# Patient Record
Sex: Male | Born: 1975 | Race: White | Hispanic: Yes | Marital: Single | Smoking: Current some day smoker
Health system: Southern US, Community
[De-identification: ages and names within clinical notes are randomized; demographics above are authoritative.]

## PROBLEM LIST (undated history)

## (undated) DIAGNOSIS — F329 Major depressive disorder, single episode, unspecified: Secondary | ICD-10-CM

## (undated) DIAGNOSIS — F32A Depression, unspecified: Secondary | ICD-10-CM

## (undated) DIAGNOSIS — S82899A Other fracture of unspecified lower leg, initial encounter for closed fracture: Secondary | ICD-10-CM

## (undated) DIAGNOSIS — M1990 Unspecified osteoarthritis, unspecified site: Secondary | ICD-10-CM

## (undated) DIAGNOSIS — K219 Gastro-esophageal reflux disease without esophagitis: Secondary | ICD-10-CM

## (undated) DIAGNOSIS — G709 Myoneural disorder, unspecified: Secondary | ICD-10-CM

## (undated) HISTORY — DX: Depression, unspecified: F32.A

## (undated) HISTORY — DX: Myoneural disorder, unspecified: G70.9

## (undated) HISTORY — DX: Major depressive disorder, single episode, unspecified: F32.9

## (undated) HISTORY — DX: Gastro-esophageal reflux disease without esophagitis: K21.9

---

## 2012-03-09 ENCOUNTER — Emergency Department (HOSPITAL_COMMUNITY)
Admission: EM | Admit: 2012-03-09 | Discharge: 2012-03-10 | Disposition: A | Discharge status: Home / Self Care | Payer: Self-pay | Attending: Emergency Medicine | Admitting: Emergency Medicine

## 2012-03-09 ENCOUNTER — Encounter (HOSPITAL_COMMUNITY): Payer: Self-pay | Admitting: *Deleted

## 2012-03-09 DIAGNOSIS — N451 Epididymitis: Secondary | ICD-10-CM

## 2012-03-09 DIAGNOSIS — N453 Epididymo-orchitis: Secondary | ICD-10-CM | POA: Insufficient documentation

## 2012-03-09 DIAGNOSIS — Z8739 Personal history of other diseases of the musculoskeletal system and connective tissue: Secondary | ICD-10-CM | POA: Insufficient documentation

## 2012-03-09 HISTORY — DX: Other fracture of unspecified lower leg, initial encounter for closed fracture: S82.899A

## 2012-03-09 HISTORY — DX: Unspecified osteoarthritis, unspecified site: M19.90

## 2012-03-09 LAB — URINALYSIS, ROUTINE W REFLEX MICROSCOPIC
Bilirubin Urine: NEGATIVE
Glucose, UA: NEGATIVE mg/dL
Ketones, ur: NEGATIVE mg/dL
pH: 5.5 (ref 5.0–8.0)

## 2012-03-09 LAB — URINE MICROSCOPIC-ADD ON

## 2012-03-09 MED ORDER — DOXYCYCLINE HYCLATE 100 MG PO CAPS: 100.0000 mg | ORAL_CAPSULE | Freq: Two times a day (BID) | ORAL | Status: DC

## 2012-03-09 NOTE — ED Notes (Signed)
Pt began exp L testicular pain yesterday.  Today the pain increased and he began feeling light-headed and nauseated.  Pt states minimal swelling, but no redness.

## 2012-03-09 NOTE — ED Provider Notes (Signed)
History     CSN: [id number]  Arrival date & time 03/09/12  2155   First MD Initiated Contact with Patient 03/09/12 2336      Chief Complaint  Patient presents with  . Testicle Pain    (Consider location/radiation/quality/duration/timing/severity/associated sxs/prior treatment) HPI Comments: 36 year old male with a history of left testicular pain which started yesterday, gradual in onset, persistent and associated with mild swelling. He denies any urethral discharge, dysuria, abdominal pain, fevers chills nausea or vomiting.  He admits that he may have a sexual transmitted disease based on risk factors but has no signs or symptoms of this. He denies any diarrhea, rectal bleeding, constipation. Symptoms are persistent  Patient is a 36 y.o. male presenting with testicular pain. The history is provided by the patient.  Testicle Pain Pertinent negatives include no abdominal pain.    Past Medical History  Diagnosis Date  . Arthritis   . Ankle fracture     History reviewed. No pertinent past surgical history.  No family history on file.  History  Substance Use Topics  . Smoking status: Not on file  . Smokeless tobacco: Not on file  . Alcohol Use:       Review of Systems  Constitutional: Negative for fever.  Gastrointestinal: Negative for abdominal pain.  Genitourinary: Positive for testicular pain. Negative for dysuria.    Allergies  Review of patient's allergies indicates no known allergies.  Home Medications   Current Outpatient Rx  Name Route Sig Dispense Refill  . DOXYCYCLINE HYCLATE 100 MG PO CAPS Oral Take 1 capsule (100 mg total) by mouth 2 (two) times daily. 20 capsule 0    BP 130/91  Pulse 90  Temp 99.1 F (37.3 C) (Oral)  Resp 18  SpO2 95%  Physical Exam  Nursing note and vitals reviewed. Constitutional: He appears well-developed and well-nourished. No distress.  HENT:  Head: Normocephalic and atraumatic.  Eyes: Conjunctivae normal are normal.  No scleral icterus.  Abdominal: Soft. He exhibits no distension. There is no tenderness.  Genitourinary:       Normal appearing scrotum and testicles, normal cremasteric reflexes bilaterally, no erythema induration or tenderness to the scrotum,no masses to the bilateral testicles, left testicle with minimal tenderness over the epididymis, normal position in the scrotum, no urethral dc    ED Course  Procedures (including critical care time)  Labs Reviewed  URINALYSIS, ROUTINE W REFLEX MICROSCOPIC - Abnormal; Notable for the following:    APPearance CLOUDY (*)     Specific Gravity, Urine 1.037 (*)     Protein, ur 30 (*)     Leukocytes, UA SMALL (*)     All other components within normal limits  URINE MICROSCOPIC-ADD ON  GC/CHLAMYDIA PROBE AMP, GENITAL   No results found.   1. Epididymitis       MDM  Focal left testicular pain over the epididymis, normal cremasteric reflex, doubt testicular torsion, GC Chlamydia obtained, antibiotics for home. Urology followup recommended, contact information given        Vida Roller, MD 03/09/12 2349

## 2012-03-11 LAB — GC/CHLAMYDIA PROBE AMP, GENITAL: Chlamydia, DNA Probe: NEGATIVE

## 2013-04-21 ENCOUNTER — Ambulatory Visit: Payer: Self-pay

## 2013-04-21 ENCOUNTER — Ambulatory Visit: Payer: Self-pay | Admitting: Family Medicine

## 2013-04-21 DIAGNOSIS — M25569 Pain in unspecified knee: Secondary | ICD-10-CM

## 2013-04-21 DIAGNOSIS — S8001XA Contusion of right knee, initial encounter: Secondary | ICD-10-CM

## 2013-04-21 DIAGNOSIS — R071 Chest pain on breathing: Secondary | ICD-10-CM

## 2013-04-21 DIAGNOSIS — M542 Cervicalgia: Secondary | ICD-10-CM

## 2013-04-21 DIAGNOSIS — Z043 Encounter for examination and observation following other accident: Secondary | ICD-10-CM

## 2013-04-21 DIAGNOSIS — R0789 Other chest pain: Secondary | ICD-10-CM

## 2013-04-21 DIAGNOSIS — M79671 Pain in right foot: Secondary | ICD-10-CM

## 2013-04-21 DIAGNOSIS — M25561 Pain in right knee: Secondary | ICD-10-CM

## 2013-04-21 DIAGNOSIS — M545 Low back pain, unspecified: Secondary | ICD-10-CM

## 2013-04-21 DIAGNOSIS — J45909 Unspecified asthma, uncomplicated: Secondary | ICD-10-CM

## 2013-04-21 DIAGNOSIS — M79609 Pain in unspecified limb: Secondary | ICD-10-CM

## 2013-04-21 DIAGNOSIS — S9031XA Contusion of right foot, initial encounter: Secondary | ICD-10-CM

## 2013-04-21 DIAGNOSIS — M25519 Pain in unspecified shoulder: Secondary | ICD-10-CM

## 2013-04-21 DIAGNOSIS — S8000XA Contusion of unspecified knee, initial encounter: Secondary | ICD-10-CM

## 2013-04-21 DIAGNOSIS — S9030XA Contusion of unspecified foot, initial encounter: Secondary | ICD-10-CM

## 2013-04-21 MED ORDER — MOMETASONE FURO-FORMOTEROL FUM 100-5 MCG/ACT IN AERO: 2.0000 | INHALATION_SPRAY | Freq: Two times a day (BID) | RESPIRATORY_TRACT | Status: DC

## 2013-04-21 MED ORDER — METHOCARBAMOL 750 MG PO TABS: ORAL_TABLET | ORAL | Status: DC

## 2013-04-21 MED ORDER — DICLOFENAC SODIUM 75 MG PO TBEC: 75.0000 mg | DELAYED_RELEASE_TABLET | Freq: Two times a day (BID) | ORAL | Status: DC

## 2013-04-21 MED ORDER — TRAMADOL HCL 50 MG PO TABS: 50.0000 mg | ORAL_TABLET | Freq: Three times a day (TID) | ORAL | Status: DC | PRN
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
## 2013-04-21 NOTE — Patient Instructions (Signed)
Take the muscle relaxant, methocarbamol, one in the morning, one in the afternoon, and 2 at bedtime  Take the diclofenac one twice daily. Do not take Aleve or ibuprofen while taking this.  Take the tramadol one every 6 hours as needed for more severe pain  Continue working with your chiropractor to get too loose and backup there

## 2013-04-23 ENCOUNTER — Telehealth: Payer: Self-pay

## 2013-04-23 NOTE — Telephone Encounter (Signed)
Pt calling back wanting to know update on medication. I told him we would need to await response from Dr Alwyn Ren. He understood.

## 2013-04-23 NOTE — Telephone Encounter (Signed)
Patient wants to be called at [phone].

## 2013-04-23 NOTE — Telephone Encounter (Signed)
Call patient: 1:  I prescribed robaxin, the muscle relaxant.  That is what I am sure I told him I was giving him.  2:  I do not think he needs a lot of strong pain meds.  I am not there tomorrow to prescribe for him, but if a PA will give him Norco 5/325 #20 One q6 prn severe pain  I would appreciate it, or I will write it on Friday.

## 2013-04-23 NOTE — Telephone Encounter (Signed)
PT STATES THE TRAMADOL HE WAS GIVEN ISN'T HELPING AT ALL, WOULD LIKE TO HAVE SOME HYDROCODONE INSTEAD AND ALSO HE WAS SUPPOSE TO GET FLEXERIL BUT DIDN'T PLEASE CALL PT AT [phone]   [facility] ON [address]

## 2013-04-24 MED ORDER — HYDROCODONE-ACETAMINOPHEN 5-325 MG PO TABS: 1.0000 | ORAL_TABLET | Freq: Four times a day (QID) | ORAL | Status: DC | PRN

## 2013-04-24 NOTE — Telephone Encounter (Signed)
Patient advised ready for pick up

## 2013-04-24 NOTE — Telephone Encounter (Signed)
Will call when Rx signed. He speaks [place]

## 2013-04-24 NOTE — Telephone Encounter (Signed)
Rx printed

## 2013-04-24 NOTE — Telephone Encounter (Signed)
LMOM to CB, he may need a spanish speaker to help him.

## 2013-04-24 NOTE — Telephone Encounter (Signed)
Pended will you print and sign for patient, per Dr Alwyn Ren?

## 2013-05-02 ENCOUNTER — Ambulatory Visit: Payer: Self-pay | Admitting: Family Medicine

## 2013-05-02 ENCOUNTER — Telehealth: Payer: Self-pay | Admitting: Radiology

## 2013-05-02 ENCOUNTER — Telehealth: Payer: Self-pay

## 2013-05-02 DIAGNOSIS — M25561 Pain in right knee: Secondary | ICD-10-CM

## 2013-05-02 DIAGNOSIS — R0789 Other chest pain: Secondary | ICD-10-CM

## 2013-05-02 DIAGNOSIS — M542 Cervicalgia: Secondary | ICD-10-CM

## 2013-05-02 DIAGNOSIS — R071 Chest pain on breathing: Secondary | ICD-10-CM

## 2013-05-02 DIAGNOSIS — M25569 Pain in unspecified knee: Secondary | ICD-10-CM

## 2013-05-02 DIAGNOSIS — M79671 Pain in right foot: Secondary | ICD-10-CM

## 2013-05-02 DIAGNOSIS — M79609 Pain in unspecified limb: Secondary | ICD-10-CM

## 2013-05-02 MED ORDER — DICLOFENAC SODIUM 75 MG PO TBEC: 75.0000 mg | DELAYED_RELEASE_TABLET | Freq: Two times a day (BID) | ORAL | Status: DC

## 2013-05-02 MED ORDER — METHOCARBAMOL 750 MG PO TABS: ORAL_TABLET | ORAL | Status: DC

## 2013-05-02 MED ORDER — HYDROCODONE-ACETAMINOPHEN 5-325 MG PO TABS: 1.0000 | ORAL_TABLET | Freq: Four times a day (QID) | ORAL | Status: DC | PRN

## 2013-05-02 NOTE — Telephone Encounter (Signed)
Error

## 2013-05-02 NOTE — Telephone Encounter (Signed)
Pt states that his back is still in pain and he would like a refill on hydcrodone. Best# [phone]
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
## 2013-05-02 NOTE — Patient Instructions (Signed)
Use the pain medication as needed.  Be careful of sedation.  You can use the voltaren for inflammation, and the robaxin as needed for muscle spasm.  Let us know if you are not better in the next week or so- Sooner if worse.

## 2013-05-02 NOTE — Telephone Encounter (Signed)
Called patient. He should return to clinic for renewal.

## 2013-05-19 ENCOUNTER — Telehealth: Payer: Self-pay

## 2013-05-19 DIAGNOSIS — M25561 Pain in right knee: Secondary | ICD-10-CM

## 2013-05-19 DIAGNOSIS — M542 Cervicalgia: Secondary | ICD-10-CM

## 2013-05-19 DIAGNOSIS — M79671 Pain in right foot: Secondary | ICD-10-CM

## 2013-05-19 DIAGNOSIS — R0789 Other chest pain: Secondary | ICD-10-CM

## 2013-05-19 MED ORDER — DICLOFENAC SODIUM 75 MG PO TBEC: 75.0000 mg | DELAYED_RELEASE_TABLET | Freq: Two times a day (BID) | ORAL | Status: DC

## 2013-05-19 MED ORDER — METHOCARBAMOL 750 MG PO TABS: ORAL_TABLET | ORAL | Status: DC
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
## 2013-05-19 NOTE — Telephone Encounter (Signed)
Pended please advise.  

## 2013-05-19 NOTE — Telephone Encounter (Signed)
Pt requesting refill for pain meds/muscle relaxers   Best phone for pt is [phone]  Please call pt when ready to be picked up.

## 2014-11-22 ENCOUNTER — Ambulatory Visit (HOSPITAL_COMMUNITY)
Admission: RE | Admit: 2014-11-22 | Discharge: 2014-11-22 | Disposition: A | Discharge status: Home / Self Care | Payer: Self-pay | Source: Ambulatory Visit | Attending: Emergency Medicine | Admitting: Emergency Medicine

## 2014-11-22 ENCOUNTER — Ambulatory Visit (INDEPENDENT_AMBULATORY_CARE_PROVIDER_SITE_OTHER): Payer: Self-pay

## 2014-11-22 ENCOUNTER — Encounter (HOSPITAL_COMMUNITY): Payer: Self-pay

## 2014-11-22 ENCOUNTER — Ambulatory Visit (INDEPENDENT_AMBULATORY_CARE_PROVIDER_SITE_OTHER): Payer: Self-pay | Admitting: Emergency Medicine

## 2014-11-22 VITALS — BP 140/80 | HR 107 | Temp 98.2°F | Resp 18 | Ht 67.5 in | Wt 201.2 lb

## 2014-11-22 DIAGNOSIS — K625 Hemorrhage of anus and rectum: Secondary | ICD-10-CM | POA: Insufficient documentation

## 2014-11-22 DIAGNOSIS — R1032 Left lower quadrant pain: Secondary | ICD-10-CM | POA: Insufficient documentation

## 2014-11-22 LAB — POCT CBC
Granulocyte percent: 69.8 %G (ref 37–80)
HCT, POC: 46.3 % (ref 43.5–53.7)
Hemoglobin: 14.8 g/dL (ref 14.1–18.1)
LYMPH, POC: 2.2 (ref 0.6–3.4)
MCH: 30 pg (ref 27–31.2)
MCHC: 32.1 g/dL (ref 31.8–35.4)
MCV: 93.6 fL (ref 80–97)
MID (CBC): 0.6 (ref 0–0.9)
MPV: 9.1 fL (ref 0–99.8)
POC Granulocyte: 6.5 (ref 2–6.9)
POC LYMPH %: 23.4 % (ref 10–50)
POC MID %: 6.8 %M (ref 0–12)
Platelet Count, POC: 226 10*3/uL (ref 142–424)
RBC: 4.94 M/uL (ref 4.69–6.13)
RDW, POC: 13.3 %
WBC: 9.3 10*3/uL (ref 4.6–10.2)

## 2014-11-22 LAB — HEMOCCULT GUIAC POC 1CARD (OFFICE): FECAL OCCULT BLD: NEGATIVE

## 2014-11-22 LAB — COMPLETE METABOLIC PANEL WITH GFR
ALBUMIN: 4.6 g/dL (ref 3.5–5.2)
ALT: 28 U/L (ref 0–53)
AST: 15 U/L (ref 0–37)
Alkaline Phosphatase: 68 U/L (ref 39–117)
BUN: 14 mg/dL (ref 6–23)
CHLORIDE: 103 meq/L (ref 96–112)
CO2: 30 meq/L (ref 19–32)
CREATININE: 0.88 mg/dL (ref 0.50–1.35)
Calcium: 9.7 mg/dL (ref 8.4–10.5)
GFR, Est Non African American: >89 mL/min
GLUCOSE: 99 mg/dL (ref 70–99)
Potassium: 4 mEq/L (ref 3.5–5.3)
SODIUM: 141 meq/L (ref 135–145)
TOTAL PROTEIN: 7.4 g/dL (ref 6.0–8.3)
Total Bilirubin: 0.6 mg/dL (ref 0.2–1.2)

## 2014-11-22 LAB — POCT SEDIMENTATION RATE: POCT SED RATE: 4 mm/h (ref 0–22)

## 2014-11-22 MED ORDER — DICYCLOMINE HCL 10 MG PO CAPS: ORAL_CAPSULE | ORAL | Status: AC

## 2014-11-22 MED ORDER — IOHEXOL 300 MG/ML  SOLN
100.0000 mL | Freq: Once | INTRAMUSCULAR | Status: AC | PRN
  Administered 2014-11-22: 100 mL via INTRAVENOUS

## 2014-11-22 NOTE — Addendum Note (Signed)
Addended by: Damita LackLORING, Miosha Behe S on: 11/22/2014 02:17 PM   Modules accepted: Orders

## 2014-11-22 NOTE — Progress Notes (Addendum)
Subjective:  This chart was scribed for Lesle ChrisSteven Indria Bishara, MD by Andrew Auaven Small, ED Scribe. This patient was seen in room 13 and the patient's care was started at 12:43 PM.   Patient ID: Thomas Moyer, male    DOB: 03/03/1976, 39 y.o.   MRN: [id number]  HPI  Chief Complaint  Patient presents with  . Diarrhea    x 5-7 days  . Blood In Stools    noticed after wiping. Has occasional abdominal pain also   HPI Comments: Thomas MeekLuis Manuel Moyer is a 39 y.o. male who presents to the [facility] complaining of blood in stool. Pt has noticed bright red blood with wiping and mixed in with his stool. He has associated occasional abdominal pain, nausea and watery diarrhea which he's had 4-5 episodes of today. Pt states he has been seen for similar symptoms in the past. Pt denies decreased appetite, weight loss and recent travels. Pt lives alone and denies sick contacts.   Past Medical History  Diagnosis Date  . Arthritis   . Ankle fracture   . Neuromuscular disorder   . Depression    No past surgical history on file. Prior to Admission medications   Medication Sig Start Date End Date Taking? Authorizing Provider  ranitidine (ZANTAC) 150 MG tablet Take 150 mg by mouth daily.   Yes Historical Provider, MD   Theodosia Paling\Review of Systems  Constitutional: Negative for appetite change and unexpected weight change.  Gastrointestinal: Positive for nausea, abdominal pain, diarrhea and blood in stool.   Objective:   Physical Exam CONSTITUTIONAL: Well developed/well nourished HEAD: Normocephalic/atraumatic EYES: EOMI/PERRL ENMT: Mucous membranes moist NECK: supple no meningeal signs SPINE/BACK:entire spine nontender CV: S1/S2 noted, no murmurs/rubs/gallops noted LUNGS: Lungs are clear to auscultation bilaterally, no apparent distress ABDOMEN: soft, no rebound or guarding, bowel sounds noted throughout abdomen. Distended abdomen. Tender deep left lower quadrant. GU:no cva tenderness. Rectal exam-  no mass NEURO: Pt is awake/alert/appropriate, moves all extremitiesx4.  No facial droop.   EXTREMITIES: pulses normal/equal, full ROM SKIN: warm, color normal PSYCH: no abnormalities of mood noted, alert and oriented to situation  Filed Vitals:   11/22/14 1158  BP: 140/80  Pulse: 107  Temp: 98.2 F (36.8 C)  TempSrc: Oral  Resp: 18  Height: 5' 7.5" (1.715 m)  Weight: 201 lb 4 oz (91.286 kg)  SpO2: 97%   Results for orders placed or performed in visit on 11/22/14  POCT CBC  Result Value Ref Range   WBC 9.3 4.6 - 10.2 K/uL   Lymph, poc 2.2 0.6 - 3.4   POC LYMPH PERCENT 23.4 10 - 50 %L   MID (cbc) 0.6 0 - 0.9   POC MID % 6.8 0 - 12 %M   POC Granulocyte 6.5 2 - 6.9   Granulocyte percent 69.8 37 - 80 %G   RBC 4.94 4.69 - 6.13 M/uL   Hemoglobin 14.8 14.1 - 18.1 g/dL   HCT, POC 40.946.3 81.143.5 - 53.7 %   MCV 93.6 80 - 97 fL   MCH, POC 30.0 27 - 31.2 pg   MCHC 32.1 31.8 - 35.4 g/dL   RDW, POC 91.413.3 %   Platelet Count, POC 226 142 - 424 K/uL   MPV 9.1 0 - 99.8 fL  [facility] reading (PRIMARY) by  Dr.Ladajah Soltys there is no free air. No evidence of obstruction. No calculi are seen.    Assessment & Plan:  Patient does have left lower quadrant pain. This certainly could  be irritable bowel syndrome. We'll see if we go ahead and CT is abdomen pelvis for left lower quadrant abdominal pain. I pReferral also made to GI further rectal bleedingersonally performed the services described in this documentation, which was scribed in my presence. The recorded information has been reviewed and is accurate. Call report CT abdomen pelvis is normal. Will treat with Bentyl Earl Lites, MD

## 2014-11-22 NOTE — Patient Instructions (Signed)
Register in the Emergency Room for your CT scan / tell them you are there for outpatient CT scan / go now  [facility] / [facility] department  Driving directions to [facility] [address]  [phone]  - more info    [address]  [place], [place] [address]     1. Head south on [address] toward [address]      0.5 mi    2. Sharp left onto [address]      0.6 mi    3. Turn left onto the [address]      0.2 mi    4. Merge onto [address]      3.0 mi    5. Continue straight to stay on [address]      0.4 mi    6. Slight left to stay on [address]      1.2 mi    7. Turn right onto [address]      0.1 mi    8. Turn left onto [address]      361 ft    9. Take the 1st left onto [address]  Destination will be on the right

## 2014-11-22 NOTE — Addendum Note (Signed)
Addended by: Lesle ChrisAUB, Vale Mousseau A on: 11/22/2014 04:41 PM   Modules accepted: Orders

## 2014-11-22 NOTE — Addendum Note (Signed)
Addended by: Damita LackLORING, DONNA S on: 11/22/2014 02:29 PM   Modules accepted: Orders

## 2014-11-22 NOTE — Progress Notes (Deleted)
   Subjective:    Patient ID: Thomas Moyer, male    DOB: Nov 15, 1975, 39 y.o.   MRN: [id number]  HPI    Review of Systems     Objective:   Physical Exam        Assessment & Plan:

## 2014-11-25 ENCOUNTER — Encounter: Payer: Self-pay | Admitting: Gastroenterology

## 2014-12-02 ENCOUNTER — Ambulatory Visit: Payer: Self-pay | Admitting: Gastroenterology

## 2015-02-02 ENCOUNTER — Ambulatory Visit: Payer: Self-pay | Admitting: Gastroenterology

## 2015-12-28 IMAGING — CT CT ABD-PELV W/ CM
2 of 4 series · 11 of 46 positions shown, 12 images · IV contrast (Iodine)
Comparison: Plain films of earlier today.

CLINICAL DATA: Left lower quadrant pain with rectal bleeding.

EXAM:
CT ABDOMEN AND PELVIS WITH CONTRAST
TECHNIQUE: Multidetector CT imaging of the abdomen and pelvis was performed
using the standard protocol following bolus administration of
intravenous contrast.
CONTRAST:  100mL OMNIPAQUE IOHEXOL 300 MG/ML  SOLN

[Series 201: routine, idose (2) · axial · 0.78mm/px · z∈[+95,+495]mm · 8 of 96 slices shown, 9 images]
[im 8/96  soft-tissue]
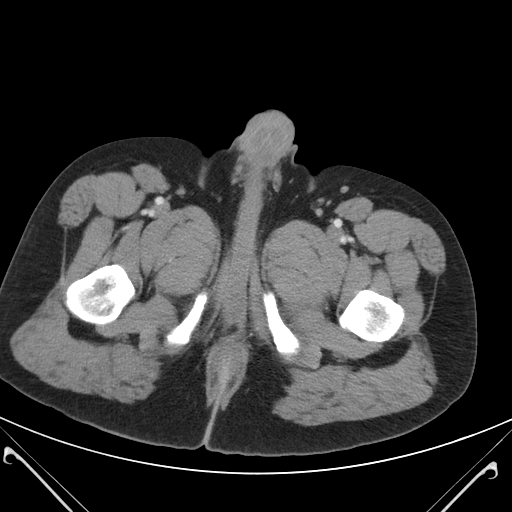
[im 8/96  bone]
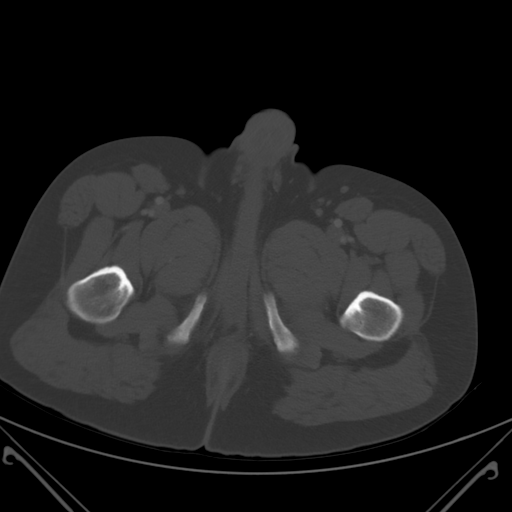
[im 20/96  soft-tissue]
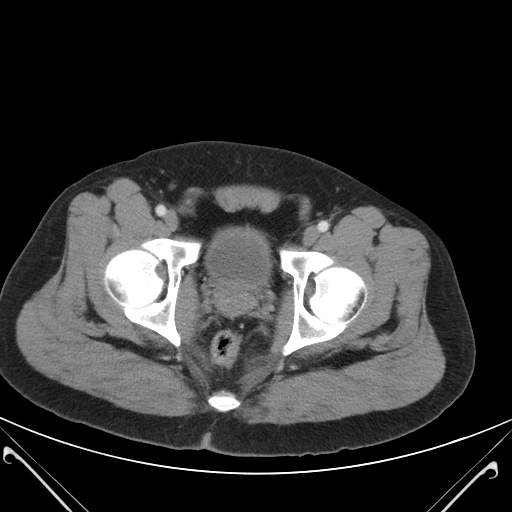
[im 32/96  soft-tissue]
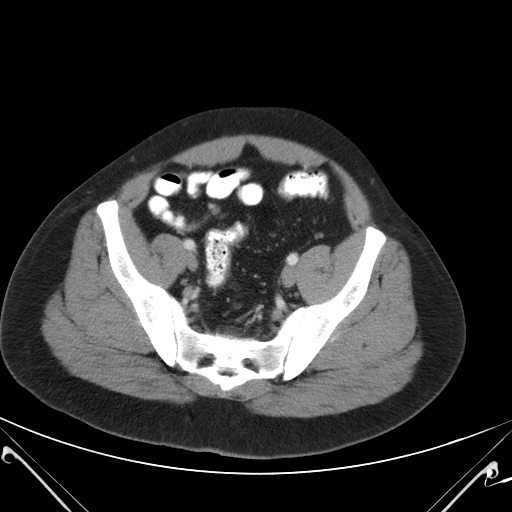
[im 44/96  soft-tissue]
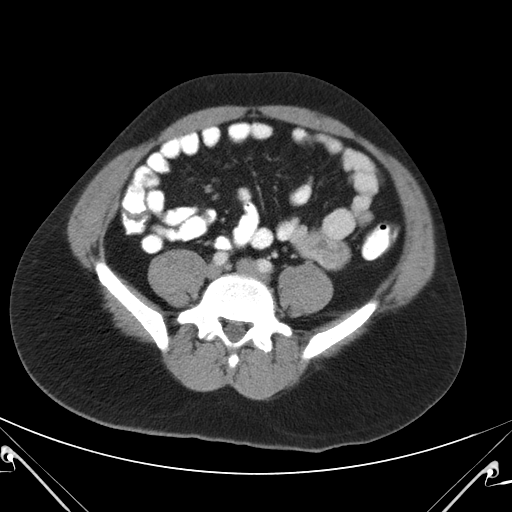
[im 52/96  soft-tissue]
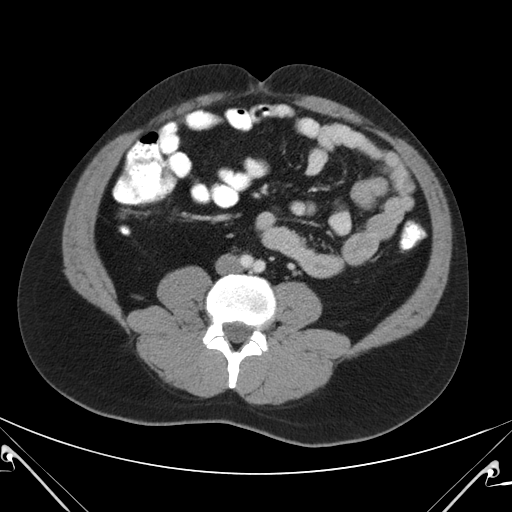
[im 64/96  soft-tissue]
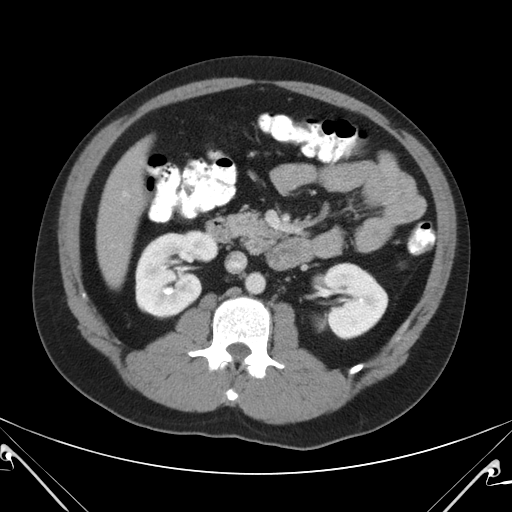
[im 76/96  soft-tissue]
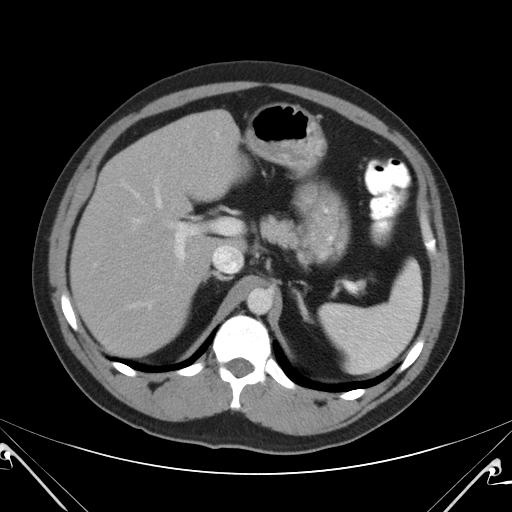
[im 88/96  soft-tissue]
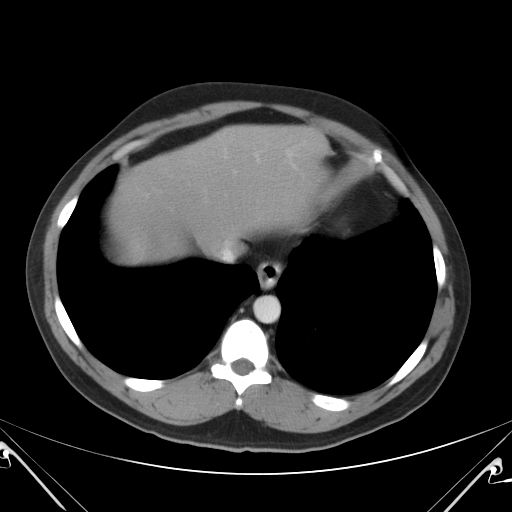

[Series 203: coronals, idose (2) · coronal · 0.45mm/px · 3 of 129 slices shown]
[im 43/129  soft-tissue]
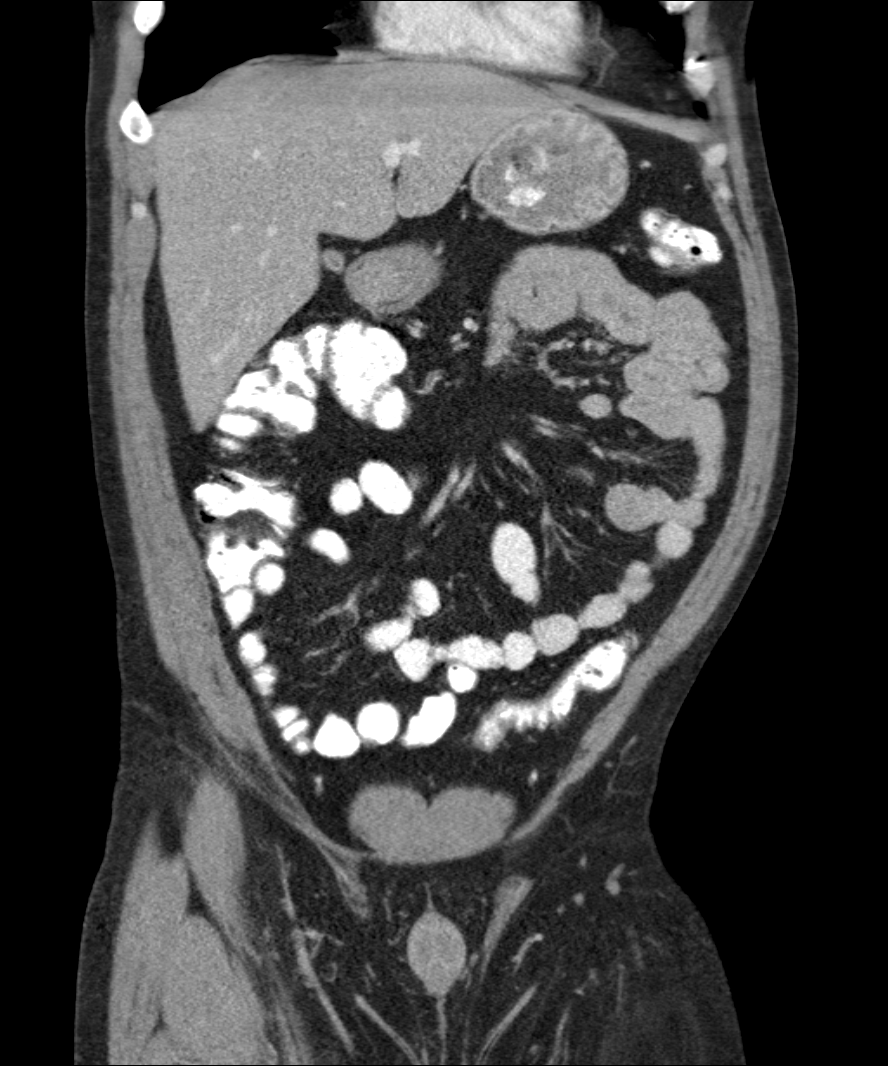
[im 57/129  soft-tissue]
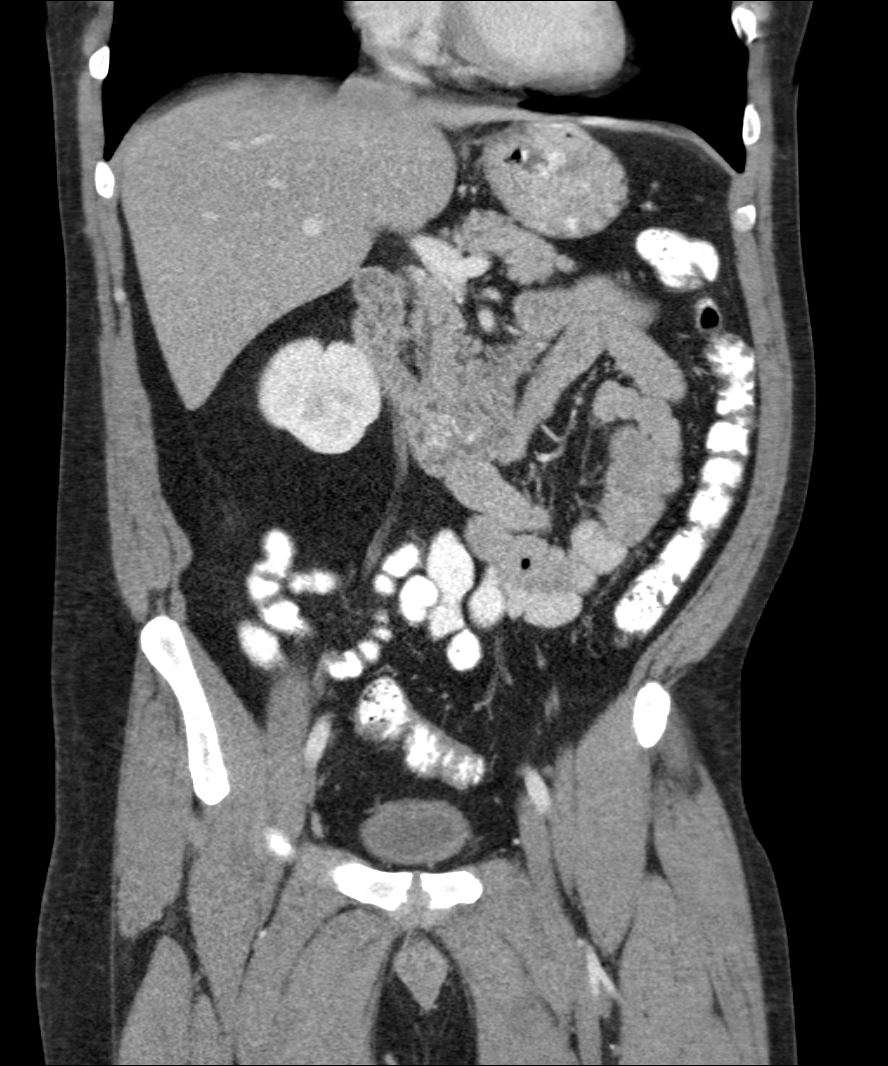
[im 72/129  soft-tissue]
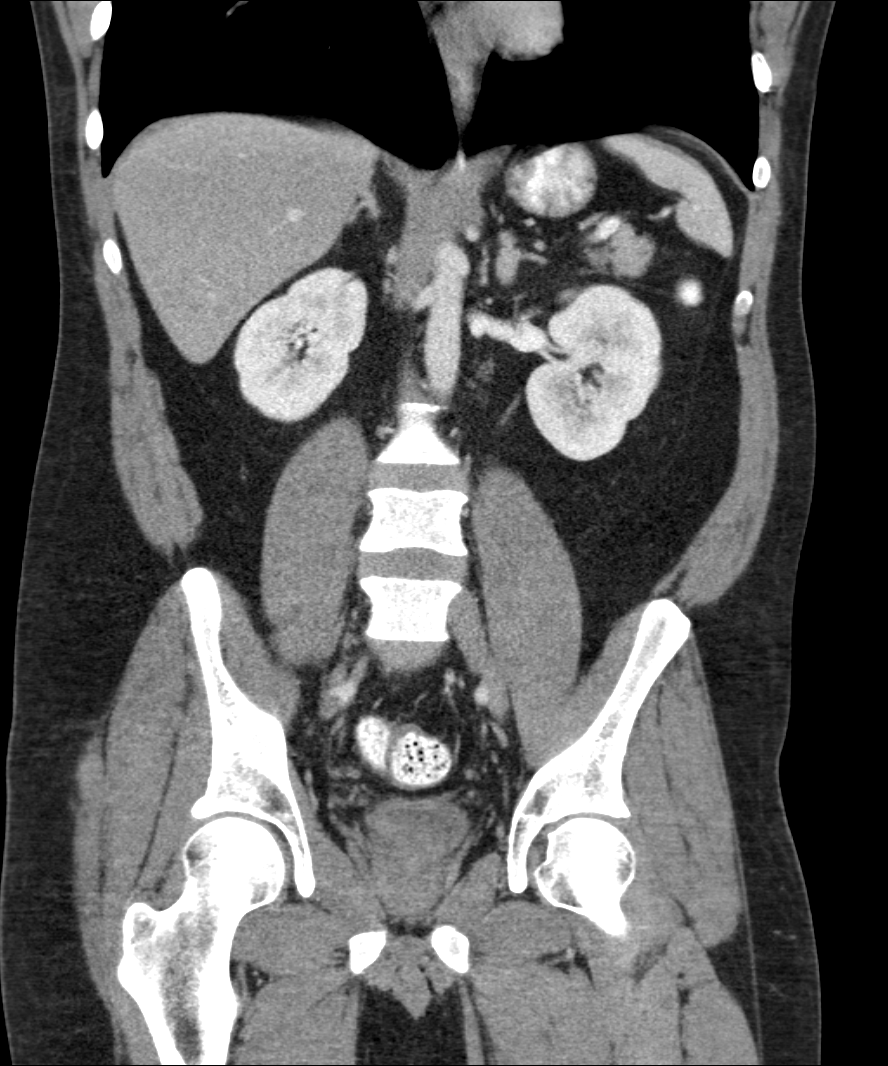

[11 of 46 positions shown; findings below may reference images not displayed]

FINDINGS: Lower chest: Clear lung bases. Normal heart size without pericardial
or pleural effusion.

Hepatobiliary: Normal liver. Normal gallbladder, without biliary
ductal dilatation.

Pancreas: Normal, without mass or ductal dilatation.

Spleen: Normal

Adrenals/Urinary Tract: Normal adrenal glands. An interpolar left
renal lesion measures 9 mm and is likely a cyst or minimally complex
cyst but technically too small to characterize. Normal right kidney,
without hydronephrosis or hydroureter. Normal urinary bladder.

Stomach/Bowel: Normal stomach, without wall thickening. Normal
colon, appendix, and terminal ileum. Normal small bowel.

Vascular/Lymphatic: Normal caliber of the aorta and branch vessels.
No abdominopelvic adenopathy.

Reproductive: Normal prostate.

Other: No significant free fluid.

Musculoskeletal: Bone island in the right iliac.
IMPRESSION: No acute process in the abdomen or pelvis. No explanation for left
lower quadrant pain.

## 2016-03-13 ENCOUNTER — Ambulatory Visit (INDEPENDENT_AMBULATORY_CARE_PROVIDER_SITE_OTHER): Payer: Self-pay | Admitting: Physician Assistant

## 2016-03-13 VITALS — BP 122/80 | HR 107 | Temp 98.9°F | Resp 18 | Ht 67.5 in | Wt 189.0 lb

## 2016-03-13 DIAGNOSIS — H578 Other specified disorders of eye and adnexa: Secondary | ICD-10-CM

## 2016-03-13 DIAGNOSIS — H5789 Other specified disorders of eye and adnexa: Secondary | ICD-10-CM

## 2016-03-13 MED ORDER — KETOROLAC TROMETHAMINE 0.5 % OP SOLN: 1.0000 [drp] | Freq: Four times a day (QID) | OPHTHALMIC | 0 refills | Status: AC

## 2016-03-13 NOTE — Progress Notes (Signed)
Subjective:    Patient ID: Thomas Moyer, male    DOB: [birth year], 40 y.o.   MRN: [id number]  Chief Complaint  Patient presents with  . Eye Pain    left eye    HPI: Presents for left eye pain which began 3 days ago while he was working on his camper, with potential exposure to fiberglass, dirt, and other unknown irritants. States it began as irritation while working and so he flushed his eyes out at that time, removed his contacts, and put glasses on. Used eye drops, flushing it out with water, and ice at home to help relieve the pain and irritation without much relief. Associated symptoms include redness and swelling, which began yesterday and has worsened today. Patient states he even felt the swelling possibly moving down toward his cheek as he could feel it in his mouth, but unsure if it is from this or from his wisdom teeth, which he needs removed. States he has had eye irritation like this before while doing similar work. Denies purulent discharge from eye, only watery drainage. Denies FB sensation, loss of vision, visual disturbances.  Of note, patient noted some blood in stool upon ROS. States he has had this before and noticed it again recently. Was supposed to see a GI specialist and get a colonoscopy but he does not have insurance therefore has not done it yet. States it is bright red blood on toilet paper, no other associated symptoms.  Review of Systems  Constitutional: Negative for chills and fever.  HENT: Positive for congestion and facial swelling. Negative for ear discharge, ear pain, rhinorrhea and sore throat.   Eyes: Positive for pain and redness. Negative for photophobia, discharge, itching and visual disturbance.  Respiratory: Negative for cough, chest tightness and shortness of breath.   Cardiovascular: Negative for chest pain and palpitations.  Gastrointestinal: Positive for blood in stool. Negative for abdominal pain, constipation, diarrhea, nausea and vomiting.    Genitourinary: Negative for difficulty urinating, dysuria and hematuria.  Musculoskeletal: Negative for myalgias, neck pain and neck stiffness.  Skin: Negative for rash.  Neurological: Positive for headaches. Negative for weakness.   No Known Allergies  There are no active problems to display for this patient.     Objective:   Physical Exam  HENT:  Mouth/Throat: Uvula is midline, oropharynx is clear and moist and mucous membranes are normal. No oral lesions. No trismus in the jaw. No dental abscesses, uvula swelling, lacerations or dental caries.    Eyes: EOM are normal. Pupils are equal, round, and reactive to light. Lids are everted and swept, no foreign bodies found. Right eye exhibits no chemosis, no discharge, no exudate and no hordeolum. No foreign body present in the right eye. Left eye exhibits no chemosis, no discharge, no exudate and no hordeolum. No foreign body present in the left eye. Right conjunctiva is not injected. Right conjunctiva has no hemorrhage. Left conjunctiva is injected. Left conjunctiva has no hemorrhage. No scleral icterus. Right eye exhibits no nystagmus. Left eye exhibits no nystagmus.  Fundoscopic exam:      The right eye shows no arteriolar narrowing, no AV nicking, no exudate, no hemorrhage and no papilledema.       The left eye shows no arteriolar narrowing, no AV nicking, no exudate, no hemorrhage and no papilledema.  Slit lamp exam:      The left eye shows no corneal abrasion, no corneal flare, no corneal ulcer, no foreign body, no hyphema, no hypopyon, no  fluorescein uptake and no anterior chamber bulge.    Vision check by CMA was 20/25 in left and right eyes.     Assessment & Plan:  1. Irritation of left eye Discussed no findings on slit lamp exam and symptoms likely due to irritation to eye. Advised to continue wearing glasses and avoid contact lens use until irritation has cleared. Instructed to use Acular q 1 drop into left eye four times daily  and eye wash solution as needed. Advised to see opthalmology if symptoms worsen or do not resolve with eye solution. - ketorolac (ACULAR) 0.5 % ophthalmic solution; Place 1 drop into the left eye 4 (four) times daily.  Dispense: 5 mL; Refill: 0

## 2016-03-13 NOTE — Progress Notes (Signed)
Patient ID: Thomas MeekLuis Manuel Moyer, male    DOB: [birth year], 40 y.o.   MRN: [id number]  PCP: Default, Provider, MD  Subjective:   Chief Complaint  Patient presents with  . Eye Pain    left eye    HPI Presents for evaluation of LEFT eye redness, swelling and pain x 3 days.  Symptoms began while working on his camper, where there was potential for dirt, fiberglass, etc to be flying around, though he does not recall a FB to the eye specifically.   Initially it was just an irritation, and he removed his contacts and flushed the eyes. He has sued his glasses since then. The swelling of the surrounding skin started yesterday and is worse today. Ice application has not helped.   Drainage from the eye has been clear. No FB sensation, reduced/changed vision.   He has experienced similar symptoms in the past following FB to the eye doing similar work.    Review of Systems Constitutional: Negative for chills and fever.  HENT: Positive for congestion and facial swelling. Negative for ear discharge, ear pain, rhinorrhea and sore throat.   Eyes: Positive for pain and redness. Negative for photophobia, discharge, itching and visual disturbance.  Respiratory: Negative for cough, chest tightness and shortness of breath.   Cardiovascular: Negative for chest pain and palpitations.  Gastrointestinal: Positive for blood in stool (thought likely to be hemorrhoids. Referred to GI, but hasn't scheduled). Negative for abdominal pain, constipation, diarrhea, nausea and vomiting.  Genitourinary: Negative for difficulty urinating, dysuria and hematuria.  Musculoskeletal: Negative for myalgias, neck pain and neck stiffness.  Skin: Negative for rash.  Neurological: Positive for headaches. Negative for weakness.     There are no active problems to display for this patient.    Prior to Admission medications   Medication Sig Start Date End Date Taking? Authorizing Provider  ranitidine (ZANTAC) 150 MG tablet  Take 150 mg by mouth daily.   Yes Historical Provider, MD  dicyclomine (BENTYL) 10 MG capsule Take 1-2 capsules twice a day as needed for abdominal cramping Patient not taking: Reported on 03/13/2016 11/22/14   Collene GobbleSteven A Daub, MD     No Known Allergies     Objective:  Physical Exam  Constitutional: He is oriented to person, place, and time. He appears well-developed and well-nourished. He is active and cooperative. No distress.  BP 122/80 (BP Location: Right Arm, Patient Position: Sitting, Cuff Size: Small)   Pulse (!) 107   Temp 98.9 F (37.2 C) (Oral)   Resp 18   Ht 5' 7.5" (1.715 m)   Wt 189 lb (85.7 kg)   SpO2 95%   BMI 29.16 kg/m    Eyes: Conjunctivae and EOM are normal. Pupils are equal, round, and reactive to light. Lids are everted and swept, no foreign bodies found. Right eye exhibits no chemosis, no discharge, no exudate and no hordeolum. No foreign body present in the right eye. Left eye exhibits no chemosis, no discharge, no exudate and no hordeolum. No foreign body present in the left eye. No scleral icterus.  Fundoscopic exam:      The right eye shows no hemorrhage and no papilledema. The right eye shows red reflex.       The left eye shows no hemorrhage and no papilledema. The left eye shows red reflex.  Mild swelling of the eyelids on the LEFT, and the LEFT upper cheek. Local anesthesia with 1 gtt proparacaine. Fluorescein instilled. No areas of increased uptake. No  FB. Eye copiously irrigated with saline.  Pulmonary/Chest: Effort normal.  Neurological: He is alert and oriented to person, place, and time.  Psychiatric: He has a normal mood and affect. His speech is normal and behavior is normal.           Assessment & Plan:   1. Irritation of left eye Continue with glasses until symptoms are completely resolved. If worsens/persists, needs referral to eye specialist. If develops purulence drainage, would call in antibiotic drops. - ketorolac (ACULAR) 0.5 %  ophthalmic solution; Place 1 drop into the left eye 4 (four) times daily.  Dispense: 5 mL; Refill: 0   Fernande Bras, PA-C Physician Assistant-Certified [facility]

## 2016-03-13 NOTE — Patient Instructions (Addendum)
The seasonal flu vaccine with be much more affordable at your local pharmacy.  If your eye worsens, or if the symptoms persist, please let me know or see an eye specialist promptly.    IF you received an x-ray today, you will receive an invoice from [facility]. Please contact [facility] at [phone] with questions or concerns regarding your invoice.   IF you received labwork today, you will receive an invoice from [facility]. Please contact [facility] at [phone] with questions or concerns regarding your invoice.   Our billing staff will not be able to assist you with questions regarding bills from these companies.  You will be contacted with the lab results as soon as they are available. The fastest way to get your results is to activate your My Chart account. Instructions are located on the last page of this paperwork. If you have not heard from us regarding the results in 2 weeks, please contact this office.
